# Patient Record
Sex: Female | Born: 1950 | Race: White | Hispanic: No | Marital: Married | Smoking: Never smoker
Health system: Southern US, Community
[De-identification: ages and names within clinical notes are randomized; demographics above are authoritative.]

## PROBLEM LIST (undated history)

## (undated) DIAGNOSIS — K819 Cholecystitis, unspecified: Secondary | ICD-10-CM

## (undated) DIAGNOSIS — N809 Endometriosis, unspecified: Secondary | ICD-10-CM

## (undated) DIAGNOSIS — H269 Unspecified cataract: Secondary | ICD-10-CM

## (undated) DIAGNOSIS — O24419 Gestational diabetes mellitus in pregnancy, unspecified control: Secondary | ICD-10-CM

## (undated) HISTORY — DX: Endometriosis, unspecified: N80.9

## (undated) HISTORY — PX: TONSILLECTOMY: SUR1361

## (undated) HISTORY — DX: Gestational diabetes mellitus in pregnancy, unspecified control: O24.419

## (undated) HISTORY — DX: Cholecystitis, unspecified: K81.9

## (undated) HISTORY — DX: Unspecified cataract: H26.9

---

## 2003-05-27 HISTORY — PX: CHOLECYSTECTOMY: SHX55

## 2011-05-27 LAB — HM COLONOSCOPY

## 2013-05-26 LAB — HM PAP SMEAR: HM PAP: NORMAL

## 2014-03-10 DIAGNOSIS — R7401 Elevation of levels of liver transaminase levels: Secondary | ICD-10-CM | POA: Insufficient documentation

## 2014-03-10 DIAGNOSIS — K219 Gastro-esophageal reflux disease without esophagitis: Secondary | ICD-10-CM | POA: Insufficient documentation

## 2014-03-10 DIAGNOSIS — R740 Nonspecific elevation of levels of transaminase and lactic acid dehydrogenase [LDH]: Secondary | ICD-10-CM

## 2014-03-10 DIAGNOSIS — K621 Rectal polyp: Secondary | ICD-10-CM | POA: Insufficient documentation

## 2014-03-10 DIAGNOSIS — K635 Polyp of colon: Secondary | ICD-10-CM | POA: Insufficient documentation

## 2014-03-10 DIAGNOSIS — E7801 Familial hypercholesterolemia: Secondary | ICD-10-CM | POA: Insufficient documentation

## 2015-02-22 ENCOUNTER — Other Ambulatory Visit: Payer: Self-pay | Admitting: Physical Medicine and Rehabilitation

## 2015-02-22 DIAGNOSIS — M5126 Other intervertebral disc displacement, lumbar region: Secondary | ICD-10-CM

## 2015-03-10 ENCOUNTER — Ambulatory Visit
Admission: RE | Admit: 2015-03-10 | Discharge: 2015-03-10 | Disposition: A | Discharge status: Home / Self Care | Payer: BLUE CROSS/BLUE SHIELD | Source: Ambulatory Visit | Attending: Physical Medicine and Rehabilitation | Admitting: Physical Medicine and Rehabilitation

## 2015-03-10 DIAGNOSIS — M5126 Other intervertebral disc displacement, lumbar region: Secondary | ICD-10-CM

## 2015-03-29 ENCOUNTER — Other Ambulatory Visit: Payer: Self-pay | Admitting: Orthopedic Surgery

## 2015-03-29 DIAGNOSIS — M25561 Pain in right knee: Secondary | ICD-10-CM

## 2015-04-07 ENCOUNTER — Ambulatory Visit
Admission: RE | Admit: 2015-04-07 | Discharge: 2015-04-07 | Disposition: A | Discharge status: Home / Self Care | Payer: BLUE CROSS/BLUE SHIELD | Source: Ambulatory Visit | Attending: Orthopedic Surgery | Admitting: Orthopedic Surgery

## 2015-04-07 DIAGNOSIS — M25561 Pain in right knee: Secondary | ICD-10-CM

## 2015-04-30 HISTORY — PX: EYE SURGERY: SHX253

## 2015-05-11 ENCOUNTER — Encounter: Payer: Self-pay | Admitting: Behavioral Health

## 2015-05-11 ENCOUNTER — Telehealth: Payer: Self-pay | Admitting: Behavioral Health

## 2015-05-11 LAB — HM MAMMOGRAPHY

## 2015-05-11 NOTE — Telephone Encounter (Signed)
Pre-Visit Call completed with patient and chart updated.   Pre-Visit Info documented in Specialty Comments under [employer].    

## 2015-05-11 NOTE — Telephone Encounter (Signed)
Unable to reach patient at time of Pre-Visit Call.  Left message for patient to return call when available.    

## 2015-05-11 NOTE — Addendum Note (Signed)
Addended by: Harold BarbanBYRD, RONECIA E on: 05/11/2015 10:39 AM   Modules accepted: Medications

## 2015-05-14 ENCOUNTER — Encounter: Payer: Self-pay | Admitting: Physician Assistant

## 2015-05-14 ENCOUNTER — Ambulatory Visit (INDEPENDENT_AMBULATORY_CARE_PROVIDER_SITE_OTHER): Payer: BLUE CROSS/BLUE SHIELD | Admitting: Physician Assistant

## 2015-05-14 VITALS — BP 128/68 | HR 79 | Temp 97.6°F | Ht 63.75 in | Wt 138.4 lb

## 2015-05-14 DIAGNOSIS — G8929 Other chronic pain: Secondary | ICD-10-CM | POA: Insufficient documentation

## 2015-05-14 DIAGNOSIS — R1013 Epigastric pain: Secondary | ICD-10-CM

## 2015-05-14 LAB — CBC
HEMATOCRIT: 39.4 % (ref 36.0–46.0)
Hemoglobin: 13.3 g/dL (ref 12.0–15.0)
MCHC: 33.7 g/dL (ref 30.0–36.0)
MCV: 93.9 fl (ref 78.0–100.0)
PLATELETS: 209 10*3/uL (ref 150.0–400.0)
RBC: 4.2 Mil/uL (ref 3.87–5.11)
RDW: 13.2 % (ref 11.5–15.5)
WBC: 4.6 10*3/uL (ref 4.0–10.5)

## 2015-05-14 LAB — COMPREHENSIVE METABOLIC PANEL
ALBUMIN: 4.2 g/dL (ref 3.5–5.2)
ALK PHOS: 80 U/L (ref 39–117)
ALT: 44 U/L — ABNORMAL HIGH (ref 0–35)
AST: 25 U/L (ref 0–37)
BILIRUBIN TOTAL: 0.6 mg/dL (ref 0.2–1.2)
BUN: 14 mg/dL (ref 6–23)
CALCIUM: 9.8 mg/dL (ref 8.4–10.5)
CHLORIDE: 105 meq/L (ref 96–112)
CO2: 30 mEq/L (ref 19–32)
CREATININE: 0.92 mg/dL (ref 0.40–1.20)
GFR: 65.34 mL/min (ref >60.00)
GLUCOSE: 97 mg/dL (ref 70–99)
POTASSIUM: 4.5 meq/L (ref 3.5–5.1)
SODIUM: 142 meq/L (ref 135–145)
Total Protein: 6.9 g/dL (ref 6.0–8.3)

## 2015-05-14 LAB — H. PYLORI ANTIBODY, IGG: H Pylori IgG: NEGATIVE

## 2015-05-14 LAB — LIPASE: LIPASE: 49 U/L (ref 11.0–59.0)

## 2015-05-14 MED ORDER — OMEPRAZOLE 20 MG PO CPDR: 20.0000 mg | DELAYED_RELEASE_CAPSULE | Freq: Every day | ORAL | Status: AC

## 2015-05-14 NOTE — Progress Notes (Signed)
Patient presents to clinic today to establish care.  Patient c/o an episode every couple of weeks having significant heartburn and reflux associated with abdominal cramping. Sometimes having epigastric pain, worse at night. Endorses chronic nausea. Is taking Probiotics but not daily. Denies melena, hematochezia or tenesmus. Endorses some occasional loose stools. Patient is s/p cholecystectomy.  Health Maintenance: Immunizations -- Declines flu shot.  Colonoscopy --Last in 2013. Has every 5 years. Mammogram -- up-to-date 05/11/15 PAP -- 05/26/2013. Normal per patient. Followed by GYN.  Past Medical History  Diagnosis Date  . Endometriosis     Mild  . Inflammation gall bladder     s/p removal  . Cataract   . Gestational diabetes     Past Surgical History  Procedure Laterality Date  . Tonsillectomy    . Cholecystectomy  2005    emergent  . Eye surgery  04/30/15    cataract surgery    Current Outpatient Prescriptions on File Prior to Visit  Medication Sig Dispense Refill  . Besifloxacin HCl (BESIVANCE) 0.6 % SUSP Apply 1 drop to eye 3 (three) times daily.    . Bromfenac Sodium (PROLENSA) 0.07 % SOLN Apply 1 drop to eye at bedtime.    . Difluprednate (DUREZOL) 0.05 % EMUL Apply 1 drop to eye 3 (three) times daily.    . Ascorbic Acid (VITAMIN C) 1000 MG tablet Reported on 05/14/2015    . calcium carbonate (OS-CAL - DOSED IN MG OF ELEMENTAL CALCIUM) 1250 (500 CA) MG tablet Reported on 05/14/2015    . DHA-EPA-VITAMIN E PO Reported on 05/14/2015    . Multiple Vitamin (MULTI-VITAMINS) TABS Reported on 05/14/2015     No current facility-administered medications on file prior to visit.    Allergies  Allergen Reactions  . Meperidine Other (See Comments)    Hypotension  . Sulfa Antibiotics Rash    Family History  Problem Relation Age of Onset  . Hypertension Mother   . Cancer Father     Prostate Cancer    Social History   Social History  . Marital Status: Married   Spouse Name: N/A  . Number of Children: N/A  . Years of Education: N/A   Occupational History  . Not on file.   Social History Main Topics  . Smoking status: Never Smoker   . Smokeless tobacco: Never Used  . Alcohol Use: No  . Drug Use: No  . Sexual Activity: Not on file   Other Topics Concern  . Not on file   Social History Narrative   Review of Systems  Constitutional: Negative for fever and weight loss.  HENT: Negative for ear discharge, ear pain, hearing loss and tinnitus.   Eyes: Negative for blurred vision, double vision, photophobia and pain.  Respiratory: Negative for cough and shortness of breath.   Cardiovascular: Negative for chest pain and palpitations.  Gastrointestinal: Positive for heartburn, nausea and abdominal pain. Negative for vomiting, diarrhea, constipation, blood in stool and melena.  Genitourinary: Negative for dysuria, urgency, frequency, hematuria and flank pain.  Musculoskeletal: Negative for falls.  Neurological: Negative for dizziness, loss of consciousness and headaches.  Endo/Heme/Allergies: Negative for environmental allergies.  Psychiatric/Behavioral: Negative for depression, suicidal ideas, hallucinations and substance abuse. The patient is not nervous/anxious and does not have insomnia.    BP 128/68 mmHg  Pulse 79  Temp(Src) 97.6 F (36.4 C) (Oral)  Ht 5' 3.75" (1.619 m)  Wt 138 lb 6.4 oz (62.778 kg)  BMI 23.95 kg/m2  SpO2 99%  Physical  Exam  Constitutional: She is oriented to person, place, and time and well-developed, well-nourished, and in no distress.  HENT:  Head: Normocephalic and atraumatic.  Eyes: Conjunctivae are normal.  Neck: Neck supple.  Cardiovascular: Normal rate, regular rhythm, normal heart sounds and intact distal pulses.   Pulmonary/Chest: Effort normal and breath sounds normal. No respiratory distress. She has no wheezes. She has no rales. She exhibits no tenderness.  Abdominal: Soft. Bowel sounds are normal. She  exhibits no distension and no mass. There is no tenderness. There is no rebound and no guarding.  Neurological: She is alert and oriented to person, place, and time.  Skin: Skin is warm and dry. No rash noted.  Psychiatric: Affect normal.  Vitals reviewed.  Recent Results (from the past 2160 hour(s))  [facility] MAMMOGRAPHY     Status: None   Collection Time: 05/11/15 12:00 AM  Result Value Ref Range   [facility] Mammogram w/ [facility]     Assessment/Plan: Abdominal pain, chronic, epigastric EKG obtained revealing NSR. Patient with history of GERD. Concern for H., Pylori. Will check panel today to include CBC, CMP, Lipase and H. Pylori IgG. Will start daily probiotic and 20 mg Prilosec. Will alter regimen based on findings.

## 2015-05-14 NOTE — Progress Notes (Signed)
Pre visit review using our clinic review tool, if applicable. No additional management support is needed unless otherwise documented below in the visit note. 

## 2015-05-14 NOTE — Patient Instructions (Signed)
Your EKG looks good!  Please go to the lab for blood work. I will call with your results. We will alter your treatment based on results but for now please start the Prilosec as directed. Restart your probiotic and take daily.  Avoid alcohol and anti-inflammatories as they can worsen symptoms. Follow the diet below.  Food Choices for Gastroesophageal Reflux Disease, Adult When you have gastroesophageal reflux disease (GERD), the foods you eat and your eating habits are very important. Choosing the right foods can help ease the discomfort of GERD. WHAT GENERAL GUIDELINES DO I NEED TO FOLLOW?  Choose fruits, vegetables, whole grains, low-fat dairy products, and low-fat meat, fish, and poultry.  Limit fats such as oils, salad dressings, butter, nuts, and avocado.  Keep a food diary to identify foods that cause symptoms.  Avoid foods that cause reflux. These may be different for different people.  Eat frequent small meals instead of three large meals each day.  Eat your meals slowly, in a relaxed setting.  Limit fried foods.  Cook foods using methods other than frying.  Avoid drinking alcohol.  Avoid drinking large amounts of liquids with your meals.  Avoid bending over or lying down until 2-3 hours after eating. WHAT FOODS ARE NOT RECOMMENDED? The following are some foods and drinks that may worsen your symptoms: Vegetables Tomatoes. Tomato juice. Tomato and spaghetti sauce. Chili peppers. Onion and garlic. Horseradish. Fruits Oranges, grapefruit, and lemon (fruit and juice). Meats High-fat meats, fish, and poultry. This includes hot dogs, ribs, ham, sausage, salami, and bacon. Dairy Whole milk and chocolate milk. Sour cream. Cream. Butter. Ice cream. Cream cheese.  Beverages Coffee and tea, with or without caffeine. Carbonated beverages or energy drinks. Condiments Hot sauce. Barbecue sauce.  Sweets/Desserts Chocolate and cocoa. Donuts. Peppermint and spearmint. Fats and  Oils High-fat foods, including [place] fries and potato chips. Other Vinegar. Strong spices, such as black pepper, white pepper, red pepper, cayenne, curry powder, cloves, ginger, and chili powder. The items listed above may not be a complete list of foods and beverages to avoid. Contact your dietitian for more information.   This information is not intended to replace advice given to you by your health care provider. Make sure you discuss any questions you have with your health care provider.   Document Released: 05/12/2005 Document Revised: 06/02/2014 Document Reviewed: 03/16/2013 Elsevier Interactive Patient Education [employer]

## 2015-05-14 NOTE — Assessment & Plan Note (Signed)
EKG obtained revealing NSR. Patient with history of GERD. Concern for H., Pylori. Will check panel today to include CBC, CMP, Lipase and H. Pylori IgG. Will start daily probiotic and 20 mg Prilosec. Will alter regimen based on findings.

## 2017-08-18 IMAGING — MR MR KNEE*R* W/O CM
4 of 6 series · 19 of 40 positions shown · non-contrast
Comparison: None.

CLINICAL DATA: Chronic right knee pain for 11 years. Weakness and
numbness.

EXAM:
MRI OF THE RIGHT KNEE WITHOUT CONTRAST
TECHNIQUE: Multiplanar, multisequence MR imaging of the knee was performed. No
intravenous contrast was administered.

[Series 3: pd_tse_fs_tra · axial · 3.5mm · 0.39mm/px · z∈[-20,+44]mm · 3 of 25 slices shown]
[im 5/25]
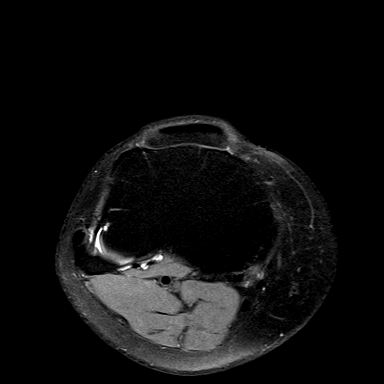
[im 13/25]
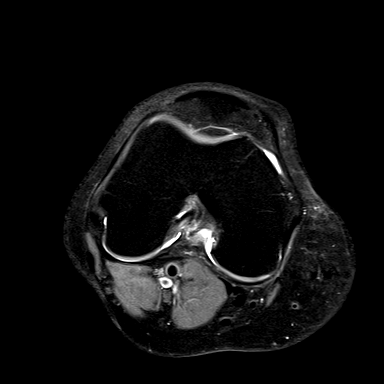
[im 21/25]
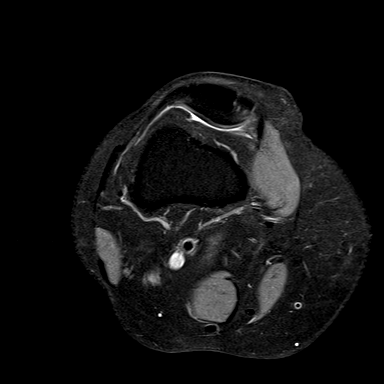

[Series 5: PD fat-sat · sagittal · 4.0mm · 0.31mm/px · 6 of 19 slices shown]
[im 1/19]
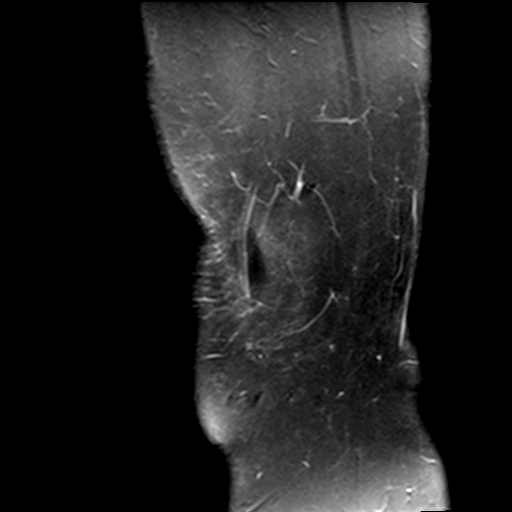
[im 4/19]
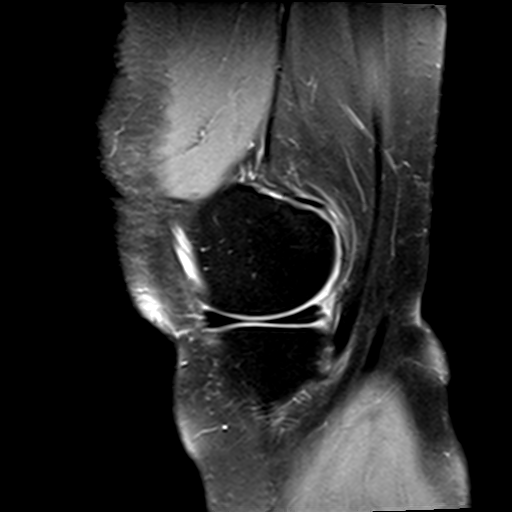
[im 8/19]
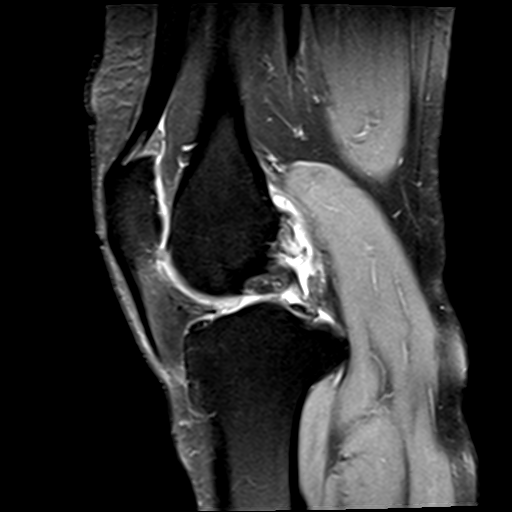
[im 11/19]
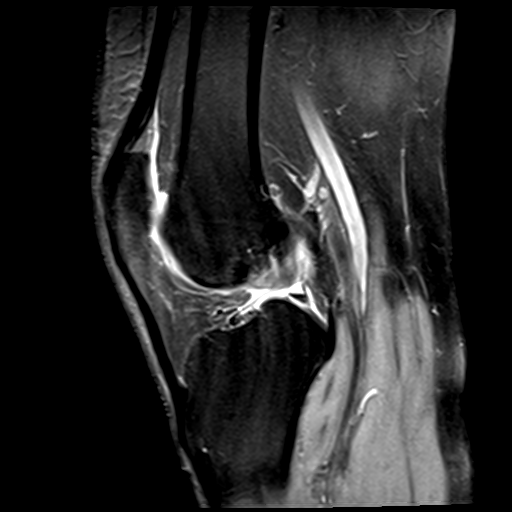
[im 15/19]
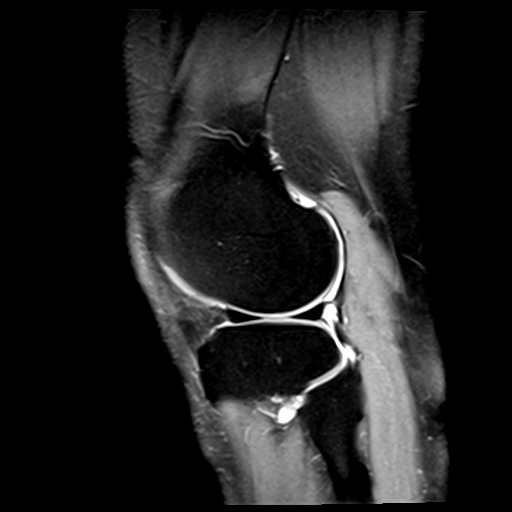
[im 19/19]
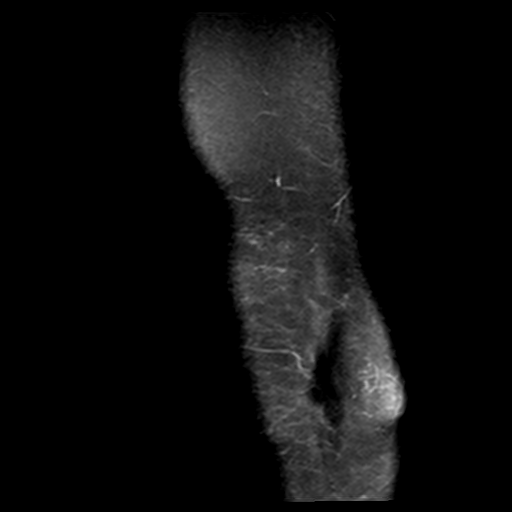

[Series 6: T2 fat-sat · coronal · 3.0mm · 0.29mm/px · 7 of 25 slices shown]
[im 1/25]
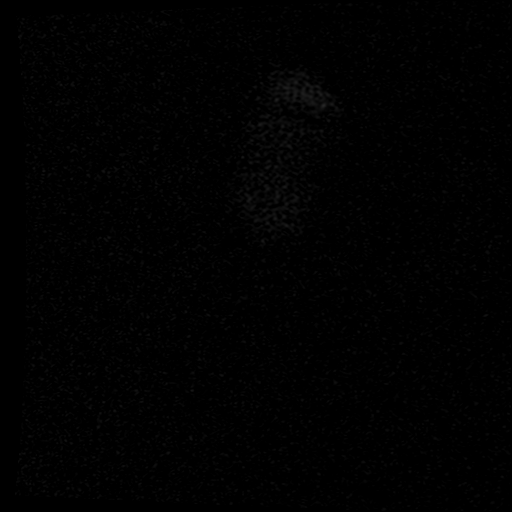
[im 4/25]
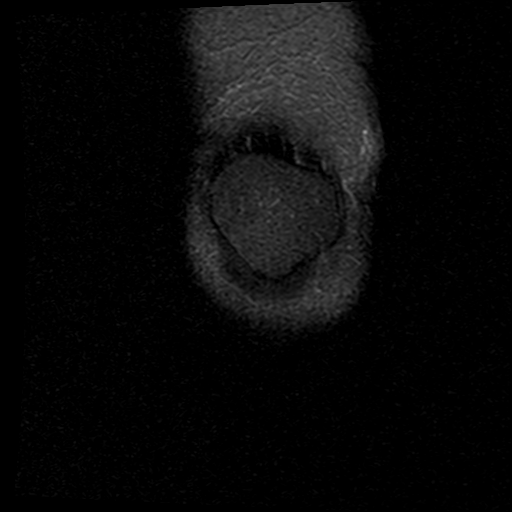
[im 7/25]
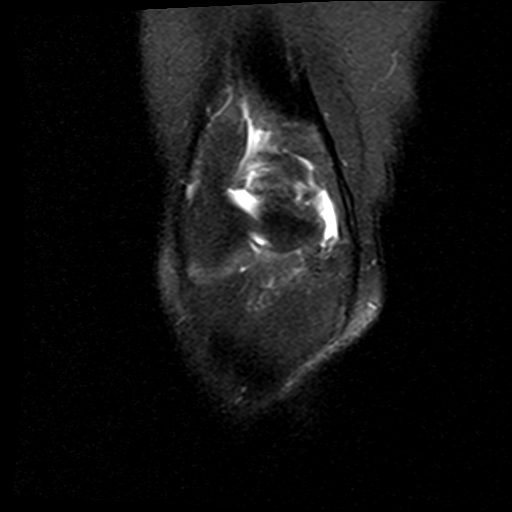
[im 11/25]
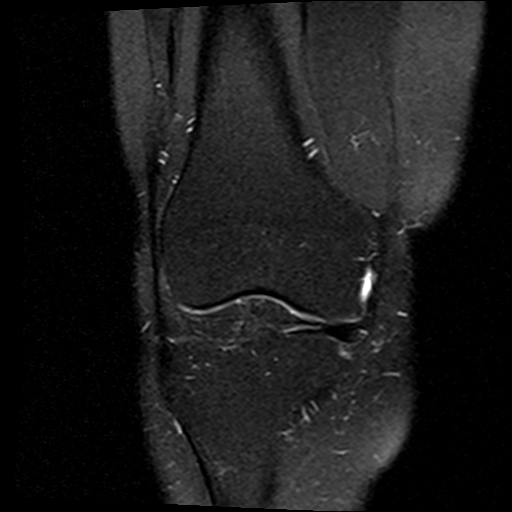
[im 14/25]
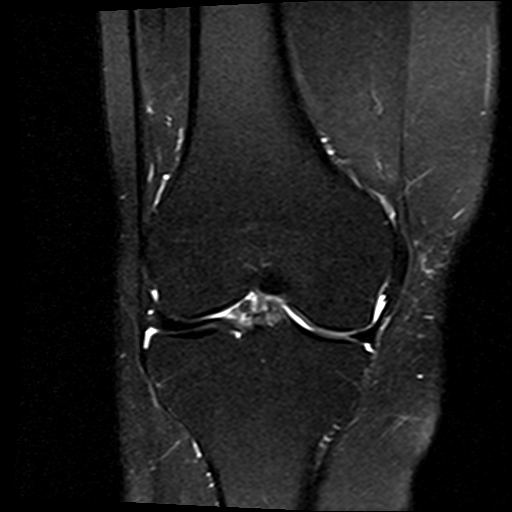
[im 18/25]
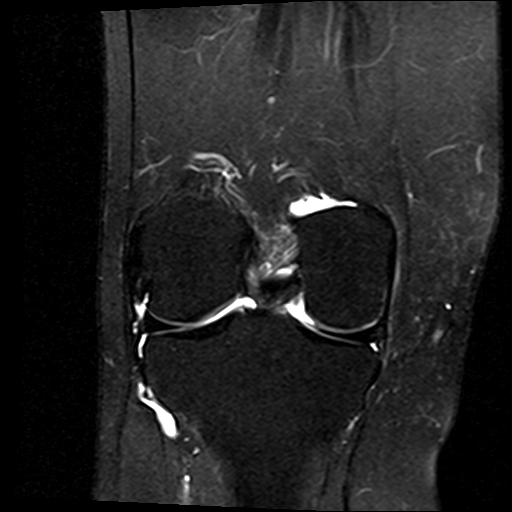
[im 21/25]
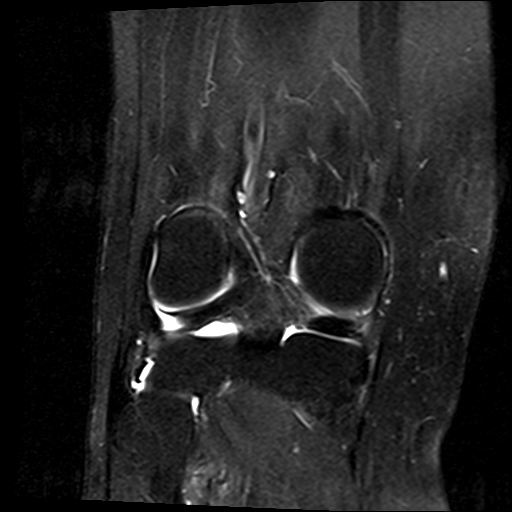

[Series 7: T1 · coronal · 3.0mm · 0.23mm/px · 3 of 25 slices shown]
[im 4/25]
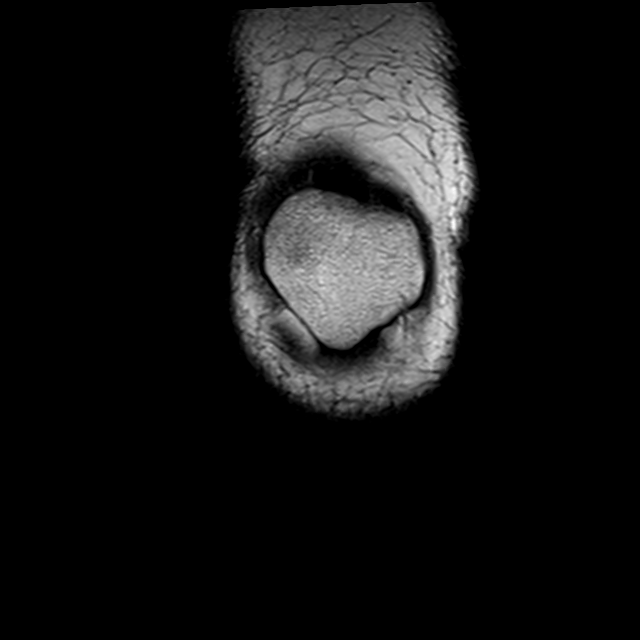
[im 14/25]
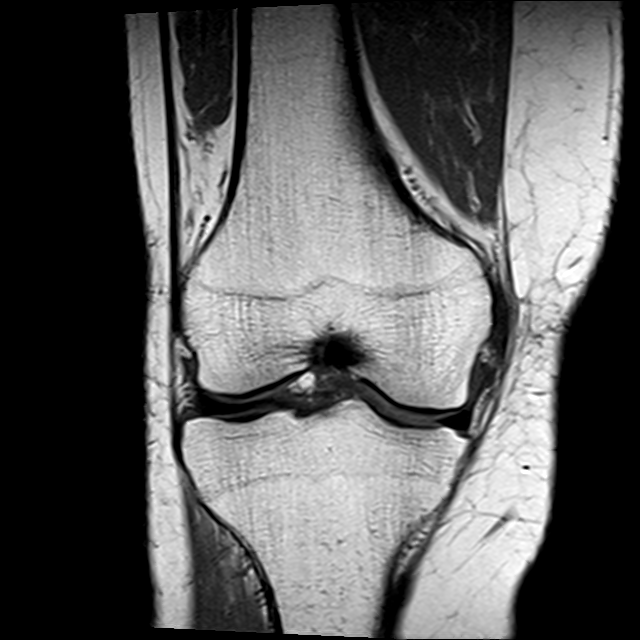
[im 21/25]
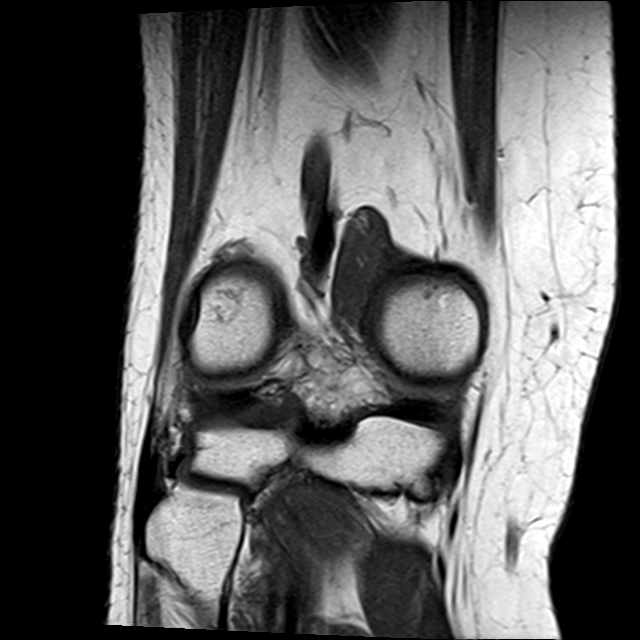

[19 of 40 positions shown; findings below may reference images not displayed]

FINDINGS: MENISCI

Medial meniscus:  Unremarkable

Lateral meniscus:  Unremarkable

LIGAMENTS

Cruciates:  Unremarkable

Collaterals:  Unremarkable

CARTILAGE

Patellofemoral: Prominent chondral thinning along the medial
patellar facet and significant portions of the femoral trochlear
groove. Moderate chondral thinning along the lateral facet and
posterior patellar ridge.

Medial:  Moderate to prominent degenerative chondral thinning.

Lateral:  Moderate degenerative chondral thinning.

Joint:  Trace knee effusion.  Thin medial plica.

Popliteal Fossa:  Unremarkable

Extensor Mechanism:  Unremarkable

Bones:  Unremarkable
IMPRESSION: 1. Osteoarthritis with prominent chondral thinning in the
patellofemoral joint, moderate to prominent chondral thinning in the
medial compartment, and moderate chondral thinning in the lateral
compartment.
2. Trace knee effusion.

## 2017-10-14 ENCOUNTER — Encounter: Payer: Self-pay | Admitting: General Practice
